# Patient Record
Sex: Female | Born: 2000
Health system: Southern US, Community
[De-identification: ages and names within clinical notes are randomized; demographics above are authoritative.]

## PROBLEM LIST (undated history)

## (undated) DIAGNOSIS — Z91010 Allergy to peanuts: Secondary | ICD-10-CM

## (undated) DIAGNOSIS — J309 Allergic rhinitis, unspecified: Secondary | ICD-10-CM

## (undated) DIAGNOSIS — J189 Pneumonia, unspecified organism: Secondary | ICD-10-CM

## (undated) DIAGNOSIS — L503 Dermatographic urticaria: Secondary | ICD-10-CM

## (undated) DIAGNOSIS — F419 Anxiety disorder, unspecified: Secondary | ICD-10-CM

## (undated) DIAGNOSIS — K9041 Non-celiac gluten sensitivity: Secondary | ICD-10-CM

## (undated) DIAGNOSIS — L309 Dermatitis, unspecified: Secondary | ICD-10-CM

## (undated) HISTORY — PX: APPENDECTOMY: SHX54

## (undated) HISTORY — DX: Allergic rhinitis, unspecified: J30.9

## (undated) HISTORY — DX: Dermatitis, unspecified: L30.9

## (undated) HISTORY — DX: Allergy to peanuts: Z91.010

## (undated) HISTORY — PX: TONSILLECTOMY: SUR1361

## (undated) HISTORY — DX: Anxiety disorder, unspecified: F41.9

## (undated) HISTORY — DX: Dermatographic urticaria: L50.3

## (undated) HISTORY — PX: ADENOIDECTOMY: SUR15

## (undated) HISTORY — DX: Non-celiac gluten sensitivity: K90.41

---
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
## 2022-01-09 DIAGNOSIS — R319 Hematuria, unspecified: Secondary | ICD-10-CM | POA: Diagnosis not present

## 2022-01-09 DIAGNOSIS — N390 Urinary tract infection, site not specified: Secondary | ICD-10-CM | POA: Diagnosis not present

## 2022-01-09 DIAGNOSIS — R300 Dysuria: Secondary | ICD-10-CM | POA: Diagnosis not present

## 2022-01-21 DIAGNOSIS — Z713 Dietary counseling and surveillance: Secondary | ICD-10-CM | POA: Diagnosis not present

## 2022-01-21 DIAGNOSIS — Z0000 Encounter for general adult medical examination without abnormal findings: Secondary | ICD-10-CM | POA: Diagnosis not present

## 2022-01-21 DIAGNOSIS — Z68.41 Body mass index (BMI) pediatric, greater than or equal to 95th percentile for age: Secondary | ICD-10-CM | POA: Diagnosis not present

## 2022-01-21 DIAGNOSIS — Z113 Encounter for screening for infections with a predominantly sexual mode of transmission: Secondary | ICD-10-CM | POA: Diagnosis not present

## 2022-01-21 DIAGNOSIS — Z23 Encounter for immunization: Secondary | ICD-10-CM | POA: Diagnosis not present

## 2022-01-21 DIAGNOSIS — Z7182 Exercise counseling: Secondary | ICD-10-CM | POA: Diagnosis not present

## 2022-01-21 DIAGNOSIS — Z1331 Encounter for screening for depression: Secondary | ICD-10-CM | POA: Diagnosis not present

## 2022-03-08 DIAGNOSIS — Z3202 Encounter for pregnancy test, result negative: Secondary | ICD-10-CM | POA: Diagnosis not present

## 2022-03-08 DIAGNOSIS — N3001 Acute cystitis with hematuria: Secondary | ICD-10-CM | POA: Diagnosis not present

## 2022-05-22 DIAGNOSIS — N898 Other specified noninflammatory disorders of vagina: Secondary | ICD-10-CM | POA: Diagnosis not present

## 2022-06-24 ENCOUNTER — Encounter: Payer: Self-pay | Admitting: Family

## 2022-06-24 ENCOUNTER — Ambulatory Visit: Payer: BC Managed Care – PPO | Admitting: Family

## 2022-06-24 VITALS — BP 120/63 | HR 73 | Temp 98.5°F | Resp 16 | Ht 68.7 in | Wt 247.0 lb

## 2022-06-24 DIAGNOSIS — F32A Depression, unspecified: Secondary | ICD-10-CM | POA: Insufficient documentation

## 2022-06-24 DIAGNOSIS — Z91010 Allergy to peanuts: Secondary | ICD-10-CM | POA: Diagnosis not present

## 2022-06-24 DIAGNOSIS — L309 Dermatitis, unspecified: Secondary | ICD-10-CM

## 2022-06-24 DIAGNOSIS — J3089 Other allergic rhinitis: Secondary | ICD-10-CM | POA: Diagnosis not present

## 2022-06-24 DIAGNOSIS — Z23 Encounter for immunization: Secondary | ICD-10-CM

## 2022-06-24 DIAGNOSIS — J309 Allergic rhinitis, unspecified: Secondary | ICD-10-CM | POA: Diagnosis not present

## 2022-06-24 DIAGNOSIS — F419 Anxiety disorder, unspecified: Secondary | ICD-10-CM | POA: Diagnosis not present

## 2022-06-24 HISTORY — DX: Depression, unspecified: F32.A

## 2022-06-24 NOTE — Progress Notes (Addendum)
Subjective:   By signing my name below, I, Annette Johns, attest that this documentation has been prepared under the direction and in the presence of Annette Sons, NP 06/24/2022   Patient ID: Annette Johns, female    DOB: September 01, 2000, 21 y.o.   MRN: [id number]  Chief Complaint  Patient presents with   New Patient (Initial Visit)    Here to establish care, comes from pediatrics.     HPI Patient is in today for establishment of patient care  Establishment of Care: She is transitioning out of pediatric care.   Immunizations: She has received three Covid vaccines. She is interested in receiving the Influenza vaccine during today's visit. She plans to get the new covid booster at the pharmacy.   Pneumonia: She reports of two episodes where she had pneumonia. She states that the last case was about 7 years ago.   Peanut Allergy: She states that she was tested for allergies twice. She reports of anaphylaxis.   Anxiety: She was diagnosed with anxiety in 2020. She has not had medication for her symptoms and mostly relied on talk therapy. She has discontinued the therapy but states that her symptoms are manageable.   Surgical History: She reports that her tonsillectomy was completed in 2014, her appendectomy completed in 2016 and adenoidectomy completed in 2014.   Social History: She reports no alcohol, smoking or vaping. She is currently a Consulting civil engineer at [employer].   Exercise: She exercises about 2-3 times a week.  Diet: She eats a mostly gluten free diet due to an intolerance. Consuming gluten gives her diarrhea and bloating. She eats about 1-2  meals a day and states that it is harder for her to eat while at school. She states that during her finals week, she wasn't eating as much due to stress.   Health Maintenance Due  Topic Date Due   COVID-19 Vaccine (1) Never done   HPV VACCINES (1 - 2-dose series) Never done   HIV Screening  Never done   Hepatitis C Screening   Never done   DTaP/Tdap/Td (1 - Tdap) Never done    Past Medical History:  Diagnosis Date   Allergic rhinitis    Anxiety    Dermagraphy    Eczema    Gluten intolerance    gets dermatitis   Peanut allergy    Pneumonia     Past Surgical History:  Procedure Laterality Date   ADENOIDECTOMY     2014   APPENDECTOMY  2016   TONSILLECTOMY  2014    Family History  Problem Relation Age of Onset   Allergic rhinitis Mother    Eczema Mother    Urticaria Mother    Arthritis Mother    Eczema Father    Allergic rhinitis Sister    Eczema Sister    Allergic rhinitis Maternal Grandmother    Eczema Maternal Grandmother    COPD Maternal Grandfather    Cancer Paternal Grandmother    Angioedema Neg Hx    Asthma Neg Hx    Immunodeficiency Neg Hx     Social History   Socioeconomic History   Marital status: Single    Spouse name: Not on file   Number of children: Not on file   Years of education: Not on file   Highest education level: Not on file  Occupational History   Not on file  Tobacco Use   Smoking status: Never   Smokeless tobacco: Never  Vaping Use   Vaping  Use: Never used  Substance and Sexual Activity   Alcohol use: Never   Drug use: Never   Sexual activity: Not Currently    Partners: Male  Other Topics Concern   Not on file  Social History Narrative   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     drospirenone-ethinyl estradiol (YAZ) 3-0.02 MG tablet Take by mouth.      EPINEPHrine (AUVI-Q) 0.3 mg/0.3 mL IJ SOAJ injection Use as directed for severe allergic reaction. 4 Device 2   Crisaborole (EUCRISA) 2 % OINT Apply 1 application topically 2 (two) times daily as needed (to red itchy areas). 100 g 5   diphenhydrAMINE (BENADRYL) 12.5 MG/5ML elixir Take by mouth.     Fluticasone Propionate (XHANCE) 93 MCG/ACT EXHU Place 2 sprays into the nose 2 (two) times daily. 32 mL 5   ketoconazole (NIZORAL) 2 % shampoo Use 1 application twice weekly for 8 weeks then stop 120 mL 0   levocetirizine (XYZAL) 5 MG tablet Take 1 tablet (5 mg total) by mouth every evening. 30 tablet 5   Olopatadine HCl 0.2 % SOLN One drop each eye once a day as needed. 2.5 mL 5   triamcinolone ointment (KENALOG) 0.5 % APP EXT AA 1 TO 2 TIMES A DAY FOR UP TO 10 TO 12 DAYS UTD  2   No facility-administered medications prior to visit.    Allergies  Allergen Reactions   Peanut Oil Anaphylaxis and Other (See Comments)    + testing     ROS See HPI    Objective:    Physical Exam Constitutional:      General: She is not in acute distress.    Appearance: Normal appearance. She is not ill-appearing.  HENT:     Head: Normocephalic and atraumatic.     Right Ear: Tympanic membrane, ear canal and external ear normal.     Left Ear: Tympanic membrane, ear canal and external ear normal.  Eyes:     Extraocular Movements: Extraocular movements intact.     Pupils: Pupils are equal, round, and reactive to light.  Neck:     Thyroid: No thyromegaly.  Cardiovascular:     Rate and Rhythm: Normal rate and regular rhythm.     Heart sounds: Normal heart sounds. No murmur heard.    No gallop.  Pulmonary:     Effort: Pulmonary effort is normal. No respiratory distress.     Breath sounds: Normal breath sounds. No wheezing or rales.  Lymphadenopathy:     Cervical: No cervical adenopathy.  Skin:    General: Skin is warm and dry.  Neurological:     Mental Status: She is alert and oriented to person,  place, and time.  Psychiatric:        Mood and Affect: Mood normal.        Behavior: Behavior normal.        Judgment: Judgment normal.     BP 120/63 (BP  Location: Right Arm, Patient Position: Sitting, Cuff Size: Small)   Pulse 73   Temp 98.5 F (36.9 C) (Oral)   Resp 16   Ht 5' 8.7" (1.745 m)   Wt 247 lb (112 kg)   SpO2 99%   BMI 36.79 kg/m  Wt Readings from Last 3 Encounters:  06/24/22 247 lb (112 kg) (>99 %, Z= 2.46)*  02/10/18 207 lb (93.9 kg) (99 %, Z= 2.29)*  04/22/15 160 lb 1.6 oz (72.6 kg) (99 %, Z= 2.20)*   * Growth percentiles are based on CDC (Girls, 2-20 Years) data.       Assessment & Plan:   Problem List Items Addressed This Visit       Unprioritized   Perennial and seasonal allergic rhinitis    Currently stable.       Peanut allergy - Primary    Keeps epipen on hand for emergency.       Eczema    She controls this with a gluten free diet.       Anxiety    Currently stable. Has managed with talk therapy in the past.       Allergic rhinitis    Currently stable. Uses zyrtec prn.       Other Visit Diagnoses     Needs flu shot       Relevant Orders   Flu Vaccine QUAD 6+ mos PF IM (Fluarix Quad PF) (Completed)      No orders of the defined types were placed in this encounter.  32 minutes spent on today's visit. Time was spent reviewing outside records, interviewing and examining patient.   I, Nance Pear, NP, personally preformed the services described in this documentation.  All medical record entries made by the scribe were at my direction and in my presence.  I have reviewed the chart and discharge instructions (if applicable) and agree that the record reflects my personal performance and is accurate and complete. 06/24/2022   I,Amber Collins,acting as a scribe for Nance Pear, NP.,have documented all relevant documentation on the behalf of Nance Pear, NP,as directed by  Nance Pear, NP while in the  presence of Nance Pear, NP.    Nance Pear, NP

## 2022-06-24 NOTE — Assessment & Plan Note (Signed)
Currently stable. Uses zyrtec prn.

## 2022-06-24 NOTE — Assessment & Plan Note (Signed)
Currently stable.

## 2022-06-24 NOTE — Assessment & Plan Note (Signed)
Currently stable. Has managed with talk therapy in the past.

## 2022-06-24 NOTE — Assessment & Plan Note (Signed)
Keeps epipen on hand for emergency.

## 2022-06-24 NOTE — Assessment & Plan Note (Signed)
She controls this with a gluten free diet.

## 2022-07-02 ENCOUNTER — Encounter: Payer: Self-pay | Admitting: Family

## 2022-07-08 ENCOUNTER — Ambulatory Visit: Payer: BC Managed Care – PPO | Admitting: Family

## 2022-07-08 VITALS — BP 133/76 | HR 96 | Temp 98.6°F | Resp 16 | Wt 226.0 lb

## 2022-07-08 DIAGNOSIS — R634 Abnormal weight loss: Secondary | ICD-10-CM

## 2022-07-08 DIAGNOSIS — R630 Anorexia: Secondary | ICD-10-CM | POA: Insufficient documentation

## 2022-07-08 DIAGNOSIS — K5901 Slow transit constipation: Secondary | ICD-10-CM

## 2022-07-08 LAB — CBC WITH DIFFERENTIAL/PLATELET
Basophils Absolute: 0.1 10*3/uL (ref 0.0–0.1)
Basophils Relative: 0.7 % (ref 0.0–3.0)
Eosinophils Absolute: 0.2 10*3/uL (ref 0.0–0.7)
Eosinophils Relative: 2.3 % (ref 0.0–5.0)
HCT: 38.8 % (ref 36.0–49.0)
Hemoglobin: 13.3 g/dL (ref 12.0–16.0)
Lymphocytes Relative: 32 % (ref 24.0–48.0)
Lymphs Abs: 3 10*3/uL (ref 0.7–4.0)
MCHC: 34.3 g/dL (ref 31.0–37.0)
MCV: 86.8 fl (ref 78.0–98.0)
Monocytes Absolute: 0.6 10*3/uL (ref 0.1–1.0)
Monocytes Relative: 6.6 % (ref 3.0–12.0)
Neutro Abs: 5.5 10*3/uL (ref 1.4–7.7)
Neutrophils Relative %: 58.4 % (ref 43.0–71.0)
Platelets: 276 10*3/uL (ref 150.0–575.0)
RBC: 4.47 Mil/uL (ref 3.80–5.70)
RDW: 12.5 % (ref 11.4–15.5)
WBC: 9.4 10*3/uL (ref 4.5–13.5)

## 2022-07-08 LAB — COMPREHENSIVE METABOLIC PANEL
ALT: 13 U/L (ref 0–35)
AST: 13 U/L (ref 0–37)
Albumin: 4.5 g/dL (ref 3.5–5.2)
Alkaline Phosphatase: 51 U/L (ref 47–119)
BUN: 8 mg/dL (ref 6–23)
CO2: 23 mEq/L (ref 19–32)
Calcium: 9.9 mg/dL (ref 8.4–10.5)
Chloride: 102 mEq/L (ref 96–112)
Creatinine, Ser: 0.85 mg/dL (ref 0.40–1.20)
GFR: 99.43 mL/min (ref >60.00)
Glucose, Bld: 98 mg/dL (ref 70–99)
Potassium: 4.3 mEq/L (ref 3.5–5.1)
Sodium: 137 mEq/L (ref 135–145)
Total Bilirubin: 0.7 mg/dL (ref 0.2–1.2)
Total Protein: 7.3 g/dL (ref 6.0–8.3)

## 2022-07-08 LAB — TSH: TSH: 0.68 u[IU]/mL (ref 0.40–5.00)

## 2022-07-08 NOTE — Assessment & Plan Note (Signed)
Wt Readings from Last 3 Encounters:  07/08/22 226 lb (102.5 kg) (99 %, Z= 2.27)*  06/24/22 247 lb (112 kg) (>99 %, Z= 2.46)*  02/10/18 207 lb (93.9 kg) (99 %, Z= 2.29)*   * Growth percentiles are based on CDC (Girls, 2-20 Years) data.   She is out of her routine being home on break from college. Will obtain labs as ordered.  Plan follow up in 1 month after she has been back to school. If persistent weight loss at that time, would consider abdominal imaging and GI evaluation.

## 2022-07-08 NOTE — Assessment & Plan Note (Signed)
Reports that she moves her bowels most days- no current concerns about constipation.

## 2022-07-08 NOTE — Assessment & Plan Note (Signed)
Labs as ordered

## 2022-07-08 NOTE — Progress Notes (Signed)
Subjective:   By signing my name below, I, Shehryar Baig, attest that this documentation has been prepared under the direction and in the presence of [name], NP. 07/08/2022   Patient ID: Annette Johns, female    DOB: [birth year], 22 y.o.   MRN: [id number]  Chief Complaint  Patient presents with   Anorexia    Patient complains of having little appetite at times.     HPI Patient is in today for a office visit.   She has lost 20 lb's recently due to having little to no appetite. She had nausea on 06/30/2022 due to not eating but has not had any since then. She is also experiencing more muscle cramps more often. She was experiencing more diarrhea around 06/30/2024 but her symptoms are resolved at this time. She feels warmer than usual. Her mother noticed her decreased appetite and urged her to make an appointment. She typically has low appetite while at school due to lack of appetizing food choices.   Wt Readings from Last 3 Encounters:  07/08/22 226 lb (102.5 kg) (99 %, Z= 2.27)*  06/24/22 247 lb (112 kg) (>99 %, Z= 2.46)*  02/10/18 207 lb (93.9 kg) (99 %, Z= 2.29)*   * Growth percentiles are based on CDC (Girls, 2-20 Years) data.    Past Medical History:  Diagnosis Date   Allergic rhinitis    Anxiety    Dermagraphy    Eczema    Gluten intolerance    gets dermatitis   Peanut allergy    Pneumonia     Past Surgical History:  Procedure Laterality Date   ADENOIDECTOMY     2014   APPENDECTOMY  2016   TONSILLECTOMY  2014    Family History  Problem Relation Age of Onset   Allergic rhinitis Mother    Eczema Mother    Urticaria Mother    Arthritis Mother    Eczema Father    Allergic rhinitis Sister    Eczema Sister    Allergic rhinitis Maternal Grandmother    Eczema Maternal Grandmother    COPD Maternal Grandfather    Cancer Paternal Grandmother    Angioedema Neg Hx    Asthma Neg Hx    Immunodeficiency Neg Hx     Social History   Socioeconomic  History   Marital status: Single    Spouse name: Not on file   Number of children: Not on file   Years of education: Not on file   Highest education level: Not on file  Occupational History   Not on file  Tobacco Use   Smoking status: Never   Smokeless tobacco: Never  Vaping Use   Vaping Use: Never used  Substance and Sexual Activity   Alcohol use: Never   Drug use: Never   Sexual activity: Not Currently    Partners: Male  Other Topics Concern   Not on file  Social History Narrative   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     drospirenone-ethinyl estradiol (YAZ) 3-0.02 MG tablet Take by mouth.     EPINEPHrine (AUVI-Q) 0.3 mg/0.3 mL IJ SOAJ injection Use as directed for severe allergic reaction. 4 Device 2   No facility-administered medications prior to visit.    Allergies  Allergen Reactions   Peanut Oil Anaphylaxis and Other (See Comments)    + testing     Review of Systems  Constitutional:  Positive for weight loss.       (+)decreased appetite  Gastrointestinal:  Negative for diarrhea and nausea.  Musculoskeletal:        (+)frequent muscle cramps       Objective:    Physical Exam Constitutional:      General: She is not in acute distress.    Appearance: Normal appearance. She is not ill-appearing.  HENT:     Head: Normocephalic and atraumatic.     Right Ear: External ear normal.     Left Ear: External ear normal.  Eyes:     Extraocular Movements:  Extraocular movements intact.     Pupils: Pupils are equal, round, and reactive to light.  Cardiovascular:     Rate and Rhythm: Normal rate and regular rhythm.     Heart sounds: Normal heart sounds. No murmur heard.    No gallop.  Pulmonary:     Effort: Pulmonary effort is normal. No respiratory distress.     Breath sounds: Normal breath sounds. No wheezing or rales.  Skin:    General: Skin is warm and dry.  Neurological:     Mental Status: She is alert and oriented to person, place, and time.  Psychiatric:        Judgment: Judgment normal.     BP 133/76 (BP Location: Right Arm, Patient Position: Sitting, Cuff Size: Large)   Pulse 96   Temp 98.6 F (37 C) (Oral)   Resp 16   Wt 226 lb (102.5 kg)   SpO2 99%   BMI 33.67 kg/m  Wt Readings from Last 3 Encounters:  07/08/22 226 lb (102.5 kg) (99 %, Z= 2.27)*  06/24/22 247 lb (112 kg) (>99 %, Z= 2.46)*  02/10/18 207 lb (93.9 kg) (99 %, Z= 2.29)*   * Growth percentiles are based on CDC (Girls, 2-20 Years) data.       Assessment & Plan:  Anorexia Assessment & Plan: Wt Readings from Last 3 Encounters:  07/08/22 226 lb (102.5 kg) (99 %, Z= 2.27)*  06/24/22 247 lb (112 kg) (>99 %, Z= 2.46)*  02/10/18 207 lb (93.9 kg) (99 %, Z= 2.29)*   * Growth percentiles are based on CDC (Girls, 2-20 Years) data.   She is out of her routine being home on break from college. Will obtain labs as ordered.  Plan follow up in 1 month after she has been back to school. If persistent weight loss at that time, would consider abdominal imaging and GI evaluation.    Orders: -     Comprehensive metabolic panel -     TSH -     CBC with Differential/Platelet  Weight loss Assessment & Plan: Labs as ordered.   Orders: -     Comprehensive metabolic panel -     TSH -     CBC with Differential/Platelet  Slow transit constipation Assessment & Plan: Reports that she moves her bowels most days- no current concerns about constipation.      I,  Lemont Fillers, NP, personally preformed the services  described in this documentation.  All medical record entries made by the scribe were at my direction and in my presence.  I have reviewed the chart and discharge instructions (if applicable) and agree that the record reflects my personal performance and is accurate and complete. 07/08/2022   I,Shehryar Baig,acting as a Education administrator for Nance Pear, NP.,have documented all relevant documentation on the behalf of Nance Pear, NP,as directed by  Nance Pear, NP while in the presence of Nance Pear, NP.   Nance Pear, NP

## 2022-08-08 ENCOUNTER — Encounter: Payer: Self-pay | Admitting: Family

## 2022-08-08 ENCOUNTER — Telehealth (INDEPENDENT_AMBULATORY_CARE_PROVIDER_SITE_OTHER): Payer: BC Managed Care – PPO | Admitting: Family

## 2022-08-08 DIAGNOSIS — R630 Anorexia: Secondary | ICD-10-CM

## 2022-08-08 DIAGNOSIS — F32A Depression, unspecified: Secondary | ICD-10-CM | POA: Diagnosis not present

## 2022-08-08 DIAGNOSIS — F419 Anxiety disorder, unspecified: Secondary | ICD-10-CM

## 2022-08-08 MED ORDER — ESCITALOPRAM OXALATE 10 MG PO TABS: 10.0000 mg | ORAL_TABLET | Freq: Every day | ORAL | 0 refills | Status: DC

## 2022-08-08 NOTE — Assessment & Plan Note (Signed)
She has lost some additional weight.  Hopefully when we get her mood under better control her appetite will also improve.

## 2022-08-08 NOTE — Assessment & Plan Note (Addendum)
Uncontrolled. Encouraged her to schedule an appointment with a counselor at school.  Will initiate lexapro 1/2 tab once daily for 1 week, then increase to a full tab once daily on week two.

## 2022-08-08 NOTE — Progress Notes (Signed)
MyChart Video Visit    Virtual Visit via Video Note   This visit type was conducted due to national recommendations for restrictions regarding the COVID-19 Pandemic (e.g. social distancing) in an effort to limit this patient's exposure and mitigate transmission in our community. This patient is at least at moderate risk for complications without adequate follow up. This format is felt to be most appropriate for this patient at this time. Physical exam was limited by quality of the video and audio technology used for the visit. CMA was able to get the patient set up on a video visit.  Patient location: Home. Patient and provider in visit Provider location: Office  I discussed the limitations of evaluation and management by telemedicine and the availability of in person appointments. The patient expressed understanding and agreed to proceed.  Visit Date: 08/08/2022  Today's healthcare provider: Nance Pear, NP     Subjective:    Patient ID: Annette Johns, female    DOB: 04/20/2001, 22 y.o.   MRN: [id number]  Chief Complaint  Patient presents with   Anorexia    Patient reports she is eating a little better    HPI  Weight today is 217lb appetite is still off.  Only eating 1 meal a day.  No longer experiencing as much issue with GI cramping.   Reports that she some anxiety.  Notes some trouble motivating.Sleep habits need improvement.  Goes to bed after midnight around 1-2 PM.  Wakes up around 8:30.  Reports tearfulness notes tearfulness every other day. Notes anxiety and irrational worry seems to play a roll. Relationship issues with Significant other.      Past Medical History:  Diagnosis Date   Allergic rhinitis    Anxiety    Anxiety and depression 06/24/2022   Dermagraphy    Eczema    Gluten intolerance    gets dermatitis   Peanut allergy    Pneumonia     Past Surgical History:  Procedure Laterality Date   ADENOIDECTOMY     2014    APPENDECTOMY  2016   TONSILLECTOMY  2014    Family History  Problem Relation Age of Onset   Allergic rhinitis Mother    Eczema Mother    Urticaria Mother    Arthritis Mother    Eczema Father    Allergic rhinitis Sister    Eczema Sister    Allergic rhinitis Maternal Grandmother    Eczema Maternal Grandmother    COPD Maternal Grandfather    Cancer Paternal Grandmother    Angioedema Neg Hx    Asthma Neg Hx    Immunodeficiency Neg Hx     Social History   Socioeconomic History   Marital status: Single    Spouse name: Not on file   Number of children: Not on file   Years of education: Not on file   Highest education level: Not on file  Occupational History   Not on file  Tobacco Use   Smoking status: Never   Smokeless tobacco: Never  Vaping Use   Vaping Use: Never used  Substance and Sexual Activity   Alcohol use: Never   Drug use: Never   Sexual activity: Not Currently    Partners: Male  Other Topics Concern   Not on file  Social History Narrative   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     drospirenone-ethinyl estradiol (YAZ) 3-0.02 MG tablet Take by mouth.     EPINEPHrine (AUVI-Q) 0.3 mg/0.3 mL IJ SOAJ injection Use as directed for severe allergic reaction. 4 Device 2   No facility-administered medications prior to visit.    Allergies  Allergen Reactions   Peanut Oil Anaphylaxis and Other (See Comments)    + testing     ROS  See HPI     Objective:     Physical Exam  There were no vitals taken for this visit. Wt Readings from Last 3 Encounters:  07/08/22 226 lb (102.5 kg) (99 %, Z= 2.27)*  06/24/22 247 lb (112 kg) (>99 %, Z= 2.46)*  02/10/18 207 lb (93.9 kg) (99 %, Z= 2.29)*   * Growth percentiles are based on CDC (Girls, 2-20 Years) data.   Gen: Awake, alert, no acute distress Resp: Breathing is even and non-labored Psych: calm/pleasant demeanor- slightly flattened affect Neuro: Alert and Oriented x 3, + facial symmetry, speech is clear.      Assessment & Plan:   Problem List Items Addressed This Visit       Unprioritized   Anxiety and depression - Primary    Uncontrolled. Encouraged her to schedule an appointment with a counselor at school.  Will initiate lexapro 1/2 tab once daily for 1 week, then increase to a full tab once daily on week two.       Relevant Medications   escitalopram (LEXAPRO) 10 MG tablet   Anorexia    She has lost some additional weight.  Hopefully when we get her mood under better control her appetite will also improve.         I am having Kerrie D. Iglesias start on escitalopram. I am also having her maintain her cetirizine, EPINEPHrine, and drospirenone-ethinyl estradiol.  Meds ordered this encounter  Medications   escitalopram (LEXAPRO) 10 MG tablet    Sig: Take 1 tablet (10 mg total) by mouth daily.    Dispense:  30 tablet    Refill:  0    Order Specific Question:   Supervising Provider    Answer:   Penni Homans A [[id number]]    I discussed the assessment and treatment plan with the patient. The patient was provided an opportunity to ask questions and all were answered. The patient agreed with the plan and demonstrated an understanding of the instructions.   The patient was advised to call back or seek an in-person evaluation if the symptoms worsen or if the condition fails to improve as anticipated.  Nance Pear, NP [facility] (phone) [phone] (fax)  [place]

## 2022-08-08 NOTE — Progress Notes (Deleted)
New Patient Office Visit  Subjective    Patient ID: Annette Johns, female    DOB: 14-Nov-2000  Age: 22 y.o. MRN: [id number]  CC: No chief complaint on file.   HPI Aylin Rhoads presents to establish care ***  Outpatient Encounter Medications as of 08/08/2022  Medication Sig   cetirizine (ZYRTEC) 10 MG tablet Take 10 mg by mouth daily as needed for allergies or rhinitis.   drospirenone-ethinyl estradiol (YAZ) 3-0.02 MG tablet Take by mouth.   EPINEPHrine (AUVI-Q) 0.3 mg/0.3 mL IJ SOAJ injection Use as directed for severe allergic reaction.   No facility-administered encounter medications on file as of 08/08/2022.    Past Medical History:  Diagnosis Date   Allergic rhinitis    Anxiety    Dermagraphy    Eczema    Gluten intolerance    gets dermatitis   Peanut allergy    Pneumonia     Past Surgical History:  Procedure Laterality Date   ADENOIDECTOMY     2014   APPENDECTOMY  2016   TONSILLECTOMY  2014    Family History  Problem Relation Age of Onset   Allergic rhinitis Mother    Eczema Mother    Urticaria Mother    Arthritis Mother    Eczema Father    Allergic rhinitis Sister    Eczema Sister    Allergic rhinitis Maternal Grandmother    Eczema Maternal Grandmother    COPD Maternal Grandfather    Cancer Paternal Grandmother    Angioedema Neg Hx    Asthma Neg Hx    Immunodeficiency Neg Hx     Social History   Socioeconomic History   Marital status: Single    Spouse name: Not on file   Number of children: Not on file   Years of education: Not on file   Highest education level: Not on file  Occupational History   Not on file  Tobacco Use   Smoking status: Never   Smokeless tobacco: Never  Vaping Use   Vaping Use: Never used  Substance and Sexual Activity   Alcohol use: Never   Drug use: Never   Sexual activity: Not Currently    Partners: Male  Other Topics Concern   Not on file  Social History Narrative   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  Physical Exam Vitals reviewed.  Constitutional:      General: She is not in acute distress.    Appearance: Normal appearance. She is not ill-appearing or toxic-appearing.  HENT:     Head: Normocephalic and atraumatic.     Right Ear: Tympanic membrane, ear canal and external ear normal. There is no impacted cerumen.     Left Ear: Tympanic membrane, ear canal and external ear normal. There is no impacted cerumen.     Mouth/Throat:     Mouth: Mucous membranes are moist.     Pharynx: Oropharynx is clear. No oropharyngeal exudate or posterior oropharyngeal erythema.  Eyes:  General:        Right eye: No discharge.        Left eye: No discharge.     Conjunctiva/sclera: Conjunctivae normal.     Pupils: Pupils are equal, round, and reactive to light.  Neck:     Thyroid: No thyromegaly.  Cardiovascular:     Rate and Rhythm: Normal rate and regular rhythm.     Heart sounds: Normal heart sounds. No murmur heard.    No friction rub. No gallop.  Pulmonary:     Effort: Pulmonary effort is normal. No respiratory distress.     Breath sounds: Normal breath sounds.  Abdominal:     General: Abdomen is flat. Bowel sounds are normal.     Palpations: Abdomen is soft.  Musculoskeletal:        General: Normal range of motion.     Cervical back: Normal range of motion.     Right lower leg: No edema.     Left lower leg: No edema.  Lymphadenopathy:     Cervical: No cervical adenopathy.  Skin:    General: Skin is warm and dry.  Neurological:     General: No  focal deficit present.     Mental Status: She is alert. Mental status is at baseline.  Psychiatric:        Mood and Affect: Mood normal.        Behavior: Behavior normal.        Thought Content: Thought content normal.        Judgment: Judgment normal.    {Labs (Optional):23779}    Assessment & Plan:   Preventative health care    No follow-ups on file.   Valarie Merino, NP

## 2022-08-29 DIAGNOSIS — N3000 Acute cystitis without hematuria: Secondary | ICD-10-CM | POA: Diagnosis not present

## 2022-08-29 DIAGNOSIS — R300 Dysuria: Secondary | ICD-10-CM | POA: Diagnosis not present

## 2022-09-05 ENCOUNTER — Telehealth (INDEPENDENT_AMBULATORY_CARE_PROVIDER_SITE_OTHER): Payer: BC Managed Care – PPO | Admitting: Family

## 2022-09-05 ENCOUNTER — Telehealth: Payer: BC Managed Care – PPO | Admitting: Family

## 2022-09-05 VITALS — Wt 219.0 lb

## 2022-09-05 DIAGNOSIS — R630 Anorexia: Secondary | ICD-10-CM

## 2022-09-05 DIAGNOSIS — F419 Anxiety disorder, unspecified: Secondary | ICD-10-CM | POA: Diagnosis not present

## 2022-09-05 DIAGNOSIS — F32A Depression, unspecified: Secondary | ICD-10-CM | POA: Diagnosis not present

## 2022-09-05 DIAGNOSIS — R030 Elevated blood-pressure reading, without diagnosis of hypertension: Secondary | ICD-10-CM

## 2022-09-05 MED ORDER — ESCITALOPRAM OXALATE 10 MG PO TABS: 10.0000 mg | ORAL_TABLET | Freq: Every day | ORAL | 1 refills | Status: DC

## 2022-09-05 NOTE — Assessment & Plan Note (Signed)
She has had 2 elevated readings at her school clinic.  She plans to come home for spring break in 1 week and I have advised her to schedule an in office visit so we can recheck blood pressure. If still elevated will need to consider medication treatment. We discussed importance of low sodium diet/exercise, weight loss for BP management.

## 2022-09-05 NOTE — Assessment & Plan Note (Signed)
Mood is improved with the addition of lexapro '10mg'$ . Will continue current dosing. She also plans to get established with a counselor.

## 2022-09-05 NOTE — Progress Notes (Signed)
MyChart Video Visit    Virtual Visit via Video Note    Patient location: Home Patient and provider in visit Provider location: Office  I discussed the limitations of evaluation and management by telemedicine and the availability of in person appointments. The patient expressed understanding and agreed to proceed.  Visit Date: 09/05/2022  Today's healthcare provider: Nance Pear, NP     Subjective:    Patient ID: Annette Johns, female    DOB: 2000/07/25, 22 y.o.   MRN: [id number]  Chief Complaint  Patient presents with   Anxiety    Follow up, Better with medication   Elevated  blood pressure readin    Patient reports having elevated bp reading at school clinic 162/99 and 147/82 when repeated    HPI Patient is in today for video follow up visit.   Weight: During last visit she discussed losing weight, nausea, and decreased appetite. Her most recent weight was 219 lb's. Her nausea has resolved and her appetite has improved. She notices her appetite is more so linked with her mood. She notes her appetite has felt lower this week.  Wt Readings from Last 3 Encounters:  09/05/22 219 lb (99.3 kg) (99 %, Z= 2.19)*  07/08/22 226 lb (102.5 kg) (99 %, Z= 2.27)*  06/24/22 247 lb (112 kg) (>99 %, Z= 2.46)*   * Growth percentiles are based on CDC (Girls, 2-20 Years) data.   Mood: She continues taking 10 mg lexapro daily PO and reports no new issues while taking it. Feels like motivation and mood are improved.  Less tearfulness.  UTI: She reports having a UTI since her last visit and had it treated. She notes her blood pressure was elevated while getting treated for the UTI and continued staying elevated. She reports her blood pressure measured 162/99 and 147/82 during the manual recheck at the office.  BP Readings from Last 3 Encounters:  07/08/22 133/76  06/24/22 120/63  02/10/18 128/70 (95 %, Z = 1.64 /  64 %, Z = 0.36)*   *BP percentiles are based on the 2017  AAP Clinical Practice Guideline for girls   Pulse Readings from Last 3 Encounters:  07/08/22 96  06/24/22 73  02/10/18 88    Past Medical History:  Diagnosis Date   Allergic rhinitis    Anxiety    Anxiety and depression 06/24/2022   Dermagraphy    Eczema    Gluten intolerance    gets dermatitis   Peanut allergy    Pneumonia     Past Surgical History:  Procedure Laterality Date   ADENOIDECTOMY     2014   APPENDECTOMY  2016   TONSILLECTOMY  2014    Family History  Problem Relation Age of Onset   Allergic rhinitis Mother    Eczema Mother    Urticaria Mother    Arthritis Mother    Eczema Father    Allergic rhinitis Sister    Eczema Sister    Allergic rhinitis Maternal Grandmother    Eczema Maternal Grandmother    COPD Maternal Grandfather    Cancer Paternal Grandmother    Angioedema Neg Hx    Asthma Neg Hx    Immunodeficiency Neg Hx     Social History   Socioeconomic History   Marital status: Single    Spouse name: Not on file   Number of children: Not on file   Years of education: Not on file   Highest education level: Not on file  Occupational  History   Not on file  Tobacco Use   Smoking status: Never   Smokeless tobacco: Never  Vaping Use   Vaping Use: Never used  Substance and Sexual Activity   Alcohol use: Never   Drug use: Never   Sexual activity: Not Currently    Partners: Male  Other Topics Concern   Not on file  Social History Narrative   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     drospirenone-ethinyl estradiol (YAZ) 3-0.02 MG tablet Take by mouth.     EPINEPHrine (AUVI-Q) 0.3 mg/0.3 mL IJ SOAJ injection Use as directed for severe allergic reaction. 4 Device 2   escitalopram (LEXAPRO) 10 MG tablet Take 1 tablet (10 mg total) by mouth daily. 30 tablet 0   No facility-administered medications prior to visit.    Allergies  Allergen Reactions   Peanut Oil Anaphylaxis and Other (See Comments)    + testing     Review of Systems  Constitutional:  Positive for weight loss.       Objective:    Physical Exam  Wt 219 lb (99.3 kg)   BMI 32.62 kg/m  Wt Readings from Last 3 Encounters:  09/05/22 219 lb (99.3 kg) (99 %, Z= 2.19)*  07/08/22 226 lb (102.5 kg) (99 %, Z= 2.27)*  06/24/22 247 lb (112 kg) (>99 %, Z= 2.46)*   * Growth percentiles are based on CDC (Girls, 2-20 Years) data.    Gen: Awake, alert, no acute distress Resp: Breathing is even and non-labored Psych: calm/pleasant demeanor Neuro: Alert and Oriented x 3, + facial symmetry, speech is clear.     Assessment & Plan:  Anxiety and depression Assessment & Plan: Mood is improved with the addition of lexapro '10mg'$ . Will continue current dosing. She also plans to get established with a counselor.   Anorexia Assessment & Plan: Notes appetite is improving.  Lab work for weight loss was normal last visit. I am OK with her losing weight, however I want to make sure that it is intentional.    Elevated blood pressure reading Assessment & Plan: She has had 2 elevated readings at her school clinic.  She plans to come home for spring break in 1 week and I have advised her to schedule an in office visit so we can recheck blood pressure. If still elevated will need to consider medication treatment. We discussed importance of low sodium diet/exercise, weight loss for BP management.   Other orders -     Escitalopram Oxalate; Take 1 tablet (10 mg total) by mouth  daily.  Dispense: 90 tablet; Refill: 1     I discussed the assessment and treatment plan with the patient. The patient was provided an opportunity to ask questions and all were answered. The patient agreed with the plan and demonstrated an understanding of the instructions.   The patient was advised to call back or seek an in-person evaluation if the symptoms worsen or if  the condition fails to improve as anticipated.  Nance Pear, NP [facility] (phone) [phone] (fax)  [place]    I,Shehryar Baig,acting as a scribe for Nance Pear, NP.,have documented all relevant documentation on the behalf of Nance Pear, NP,as directed by  Nance Pear, NP while in the presence of Nance Pear, NP.

## 2022-09-05 NOTE — Assessment & Plan Note (Addendum)
Notes appetite is improving.  Lab work for weight loss was normal last visit. I am OK with her losing weight, however I want to make sure that it is intentional.   Wt Readings from Last 3 Encounters:  09/05/22 219 lb (99.3 kg) (99 %, Z= 2.19)*  07/08/22 226 lb (102.5 kg) (99 %, Z= 2.27)*  06/24/22 247 lb (112 kg) (>99 %, Z= 2.46)*   * Growth percentiles are based on CDC (Girls, 2-20 Years) data.

## 2022-09-11 DIAGNOSIS — N398 Other specified disorders of urinary system: Secondary | ICD-10-CM | POA: Diagnosis not present

## 2022-09-11 DIAGNOSIS — N390 Urinary tract infection, site not specified: Secondary | ICD-10-CM | POA: Diagnosis not present

## 2022-09-16 ENCOUNTER — Ambulatory Visit: Payer: BC Managed Care – PPO | Admitting: Family

## 2022-09-16 VITALS — BP 136/85 | HR 75 | Temp 98.2°F | Resp 16 | Wt 212.0 lb

## 2022-09-16 DIAGNOSIS — R634 Abnormal weight loss: Secondary | ICD-10-CM

## 2022-09-16 DIAGNOSIS — F32A Depression, unspecified: Secondary | ICD-10-CM | POA: Diagnosis not present

## 2022-09-16 DIAGNOSIS — F419 Anxiety disorder, unspecified: Secondary | ICD-10-CM

## 2022-09-16 DIAGNOSIS — R030 Elevated blood-pressure reading, without diagnosis of hypertension: Secondary | ICD-10-CM | POA: Diagnosis not present

## 2022-09-16 NOTE — Progress Notes (Signed)
Subjective:   By signing my name below, I, Annette Johns, attest that this documentation has been prepared under the direction and in the presence of Annette Alar, NP. 09/16/2022.   Patient ID: Annette Johns, female    DOB: 2001/06/28, 22 y.o.   MRN: [id number]  Chief Complaint  Patient presents with   Blodd pressure elevated    Patient reports having elevated BP reading at school up to 162/99 with a repeat reading of 147 /82    HPI Patient is in today for an office visit.  Blood Pressure: She reports that her blood pressure was elevated when she had it checked twice at school. She believes that she may have been stressed. Of note, she also drinks a lot of caffeine and energy drinks while at school. Recently she has started cutting back on these drinks. Not on any medications such as sudafed at the time of her BP checks.  BP Readings from Last 3 Encounters:  09/16/22 136/85  07/08/22 133/76  06/24/22 120/63   Appetite:  Generally she is not feeling very hungry. She feels as though her stomach is restricted and only able to tolerate smaller portions. Her appetite is still not back to baseline and she has noticed some further weight loss. She typically uses the dining hall at school. She frequently only has 1 meal a day. Wt Readings from Last 3 Encounters:  09/16/22 212 lb (96.2 kg) (98 %, Z= 2.11)*  09/05/22 219 lb (99.3 kg) (99 %, Z= 2.19)*  07/08/22 226 lb (102.5 kg) (99 %, Z= 2.27)*   * Growth percentiles are based on CDC (Girls, 2-20 Years) data.   Mood:  Currently she is feeling stable on Lexapro 10 mg daily.  Past Medical History:  Diagnosis Date   Allergic rhinitis    Anxiety    Anxiety and depression 06/24/2022   Dermagraphy    Eczema    Gluten intolerance    gets dermatitis   Peanut allergy    Pneumonia     Past Surgical History:  Procedure Laterality Date   ADENOIDECTOMY     2014   APPENDECTOMY  2016   TONSILLECTOMY  2014    Family History   Problem Relation Age of Onset   Allergic rhinitis Mother    Eczema Mother    Urticaria Mother    Arthritis Mother    Eczema Father    Allergic rhinitis Sister    Eczema Sister    Allergic rhinitis Maternal Grandmother    Eczema Maternal Grandmother    COPD Maternal Grandfather    Cancer Paternal Grandmother    Angioedema Neg Hx    Asthma Neg Hx    Immunodeficiency Neg Hx     Social History   Socioeconomic History   Marital status: Single    Spouse name: Not on file   Number of children: Not on file   Years of education: Not on file   Highest education level: Not on file  Occupational History   Not on file  Tobacco Use   Smoking status: Never   Smokeless tobacco: Never  Vaping Use   Vaping Use: Never used  Substance and Sexual Activity   Alcohol use: Never   Drug use: Never   Sexual activity: Not Currently    Partners: Male  Other Topics Concern   Not on file  Social History Narrative   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     drospirenone-ethinyl estradiol (YAZ) 3-0.02 MG tablet Take by mouth.     EPINEPHrine (AUVI-Q) 0.3 mg/0.3 mL IJ SOAJ injection Use as directed for severe allergic reaction. 4 Device 2   escitalopram (LEXAPRO) 10 MG tablet Take 1 tablet (10 mg total) by mouth daily. 90 tablet 1   No facility-administered medications prior to visit.    Allergies  Allergen Reactions   Peanut Oil Anaphylaxis and Other (See Comments)    + testing      Review of Systems  Gastrointestinal:        Loss of appetite       Objective:    Physical Exam Constitutional:      Appearance: Normal appearance.  HENT:     Head: Normocephalic and atraumatic.     Right Ear: Tympanic membrane, ear canal and external ear normal.     Left Ear: Tympanic membrane, ear canal and external ear normal.  Eyes:     Extraocular Movements: Extraocular movements intact.     Pupils: Pupils are equal, round, and reactive to light.  Cardiovascular:     Rate and Rhythm: Normal rate and regular rhythm.     Heart sounds: Normal heart sounds. No murmur heard.    No gallop.  Pulmonary:     Effort: Pulmonary effort is normal. No respiratory distress.     Breath sounds: Normal breath sounds. No wheezing or rales.  Skin:    General: Skin is warm and dry.  Neurological:     General: No focal deficit present.     Mental Status: She is alert and oriented to person, place, and time.  Psychiatric:        Mood and Affect: Mood normal.        Behavior: Behavior normal.     BP 136/85 (BP Location: Right Arm, Patient Position: Sitting, Cuff Size: Large)   Pulse 75   Temp 98.2 F (36.8 C) (Oral)   Resp 16   Wt 212 lb (96.2 kg)   SpO2 99%   BMI 31.58 kg/m  Wt Readings from Last 3 Encounters:  09/16/22 212 lb (96.2 kg) (98 %, Z= 2.11)*  09/05/22 219 lb (99.3 kg) (99 %, Z= 2.19)*  07/08/22 226 lb (102.5 kg) (99 %, Z= 2.27)*   * Growth percentiles are based on CDC (Girls, 2-20 Years) data.     Assessment & Plan:   Problem List Items Addressed This Visit       Unprioritized   Weight loss    Wt Readings from Last 3 Encounters:  09/16/22 212 lb (96.2 kg) (98 %, Z= 2.11)*  09/05/22 219 lb (99.3 kg) (99 %, Z= 2.19)*  07/08/22 226 lb (102.5 kg) (99 %, Z= 2.27)*   * Growth percentiles are based on CDC (Girls, 2-20 Years) data.  She is only eating 1-2 times daily.  Seems that when she is stressed her appetite is less.  I encouraged her to try to eat 3 times  a day.  Recent lab work including CMET,  TSH, CBC was reassuring. Goal weight is about 180 from a BMI standpoint so I am OK with slow weight loss at this point. She will continue to monitor.        Elevated blood pressure reading - Primary    BP Readings from Last 3 Encounters:  09/16/22 136/85  07/08/22 133/76  06/24/22 120/63  BP looks OK here today.  Recommended the following:  Work on a low sodium diet.  Goal blood pressure is <140/90. Ideal is 120/80.   Please purchase a blood pressure cuff.  OMRON is one of the better ones. Check blood pressure 2 x weekly and send me any readings you get that are >140/90.        Anxiety and depression    Mood is stable/improved. Continue lexapro '10mg'$  once daily.         No orders of the defined types were placed in this encounter.   I, Nance Pear, NP, personally preformed the services described in this documentation.  All medical record entries made by the scribe were at my direction and in my presence.  I have reviewed the chart and discharge instructions (if applicable) and agree that the record reflects my personal performance and is accurate and complete. 09/16/2022.  I,Mathew Stumpf,acting as a Education administrator for [employer], NP.,have documented all relevant documentation on the behalf of Nance Pear, NP,as directed by  Nance Pear, NP while in the presence of Nance Pear, NP.   Nance Pear, NP

## 2022-09-16 NOTE — Assessment & Plan Note (Signed)
Mood is stable/improved. Continue lexapro '10mg'$  once daily.

## 2022-09-16 NOTE — Assessment & Plan Note (Addendum)
Wt Readings from Last 3 Encounters:  09/16/22 212 lb (96.2 kg) (98 %, Z= 2.11)*  09/05/22 219 lb (99.3 kg) (99 %, Z= 2.19)*  07/08/22 226 lb (102.5 kg) (99 %, Z= 2.27)*   * Growth percentiles are based on CDC (Girls, 2-20 Years) data.   She is only eating 1-2 times daily.  Seems that when she is stressed her appetite is less.  I encouraged her to try to eat 3 times a day.  Recent lab work including CMET, TSH, CBC was reassuring. Goal weight is about 180 from a BMI standpoint so I am OK with slow weight loss at this point. She will continue to monitor.

## 2022-09-16 NOTE — Patient Instructions (Signed)
Goal blood pressure is <140/90. Ideal is 120/80.   Please purchase a blood pressure cuff.  OMRON is one of the better ones. Check blood pressure 2 x weekly and send me any readings you get that are >140/90.

## 2022-09-16 NOTE — Assessment & Plan Note (Signed)
BP Readings from Last 3 Encounters:  09/16/22 136/85  07/08/22 133/76  06/24/22 120/63   BP looks OK here today.  Recommended the following:  Work on a low sodium diet.  Goal blood pressure is <140/90. Ideal is 120/80.   Please purchase a blood pressure cuff.  OMRON is one of the better ones. Check blood pressure 2 x weekly and send me any readings you get that are >140/90.

## 2022-12-05 ENCOUNTER — Other Ambulatory Visit: Payer: Self-pay | Admitting: Family

## 2022-12-05 ENCOUNTER — Encounter: Payer: Self-pay | Admitting: Family

## 2022-12-05 MED ORDER — ESCITALOPRAM OXALATE 10 MG PO TABS: 10.0000 mg | ORAL_TABLET | Freq: Every day | ORAL | 1 refills | Status: DC

## 2022-12-05 MED ORDER — DROSPIRENONE-ETHINYL ESTRADIOL 3-0.02 MG PO TABS: 1.0000 | ORAL_TABLET | Freq: Every day | ORAL | 3 refills | Status: DC

## 2022-12-05 NOTE — Telephone Encounter (Signed)
Patient is requesting a refill of the following medications: Requested Prescriptions   Pending Prescriptions Disp Refills   drospirenone-ethinyl estradiol (YAZ) 3-0.02 MG tablet 28 tablet     Sig: Take by mouth.    Date of patient request: 12/05/22 Last office visit: 09/16/2022 Date of last refill: 01/21/22 Historical Provider Last refill amount: 28 with refills

## 2023-02-04 ENCOUNTER — Encounter (INDEPENDENT_AMBULATORY_CARE_PROVIDER_SITE_OTHER): Payer: Self-pay

## 2023-02-13 DIAGNOSIS — R300 Dysuria: Secondary | ICD-10-CM | POA: Diagnosis not present

## 2023-02-23 ENCOUNTER — Encounter: Payer: BC Managed Care – PPO | Admitting: Family

## 2023-02-26 DIAGNOSIS — Z113 Encounter for screening for infections with a predominantly sexual mode of transmission: Secondary | ICD-10-CM | POA: Diagnosis not present

## 2023-02-26 DIAGNOSIS — Z1330 Encounter for screening examination for mental health and behavioral disorders, unspecified: Secondary | ICD-10-CM | POA: Diagnosis not present

## 2023-04-23 DIAGNOSIS — H5202 Hypermetropia, left eye: Secondary | ICD-10-CM | POA: Diagnosis not present

## 2023-05-04 DIAGNOSIS — N898 Other specified noninflammatory disorders of vagina: Secondary | ICD-10-CM | POA: Diagnosis not present

## 2023-05-04 DIAGNOSIS — Z113 Encounter for screening for infections with a predominantly sexual mode of transmission: Secondary | ICD-10-CM | POA: Diagnosis not present

## 2023-06-10 DIAGNOSIS — N898 Other specified noninflammatory disorders of vagina: Secondary | ICD-10-CM | POA: Diagnosis not present

## 2023-06-10 DIAGNOSIS — Z8619 Personal history of other infectious and parasitic diseases: Secondary | ICD-10-CM | POA: Diagnosis not present

## 2023-08-03 DIAGNOSIS — M79641 Pain in right hand: Secondary | ICD-10-CM | POA: Diagnosis not present

## 2023-09-22 DIAGNOSIS — M79641 Pain in right hand: Secondary | ICD-10-CM | POA: Diagnosis not present

## 2023-09-22 DIAGNOSIS — R2231 Localized swelling, mass and lump, right upper limb: Secondary | ICD-10-CM | POA: Diagnosis not present

## 2023-09-29 DIAGNOSIS — R2231 Localized swelling, mass and lump, right upper limb: Secondary | ICD-10-CM | POA: Diagnosis not present

## 2023-10-22 ENCOUNTER — Other Ambulatory Visit: Payer: Self-pay | Admitting: *Deleted

## 2023-10-22 ENCOUNTER — Encounter: Payer: Self-pay | Admitting: *Deleted

## 2023-10-22 MED ORDER — DROSPIRENONE-ETHINYL ESTRADIOL 3-0.02 MG PO TABS: 1.0000 | ORAL_TABLET | Freq: Every day | ORAL | 0 refills | Status: DC

## 2023-10-27 DIAGNOSIS — Z1330 Encounter for screening examination for mental health and behavioral disorders, unspecified: Secondary | ICD-10-CM | POA: Diagnosis not present

## 2023-10-27 DIAGNOSIS — N898 Other specified noninflammatory disorders of vagina: Secondary | ICD-10-CM | POA: Diagnosis not present

## 2023-11-17 DIAGNOSIS — M79641 Pain in right hand: Secondary | ICD-10-CM | POA: Diagnosis not present

## 2023-11-18 ENCOUNTER — Encounter: Payer: Self-pay | Admitting: Family

## 2023-11-18 ENCOUNTER — Ambulatory Visit (INDEPENDENT_AMBULATORY_CARE_PROVIDER_SITE_OTHER): Payer: Self-pay | Admitting: Family

## 2023-11-18 VITALS — BP 134/72 | HR 84 | Temp 99.1°F | Resp 16 | Ht 68.7 in | Wt 230.0 lb

## 2023-11-18 DIAGNOSIS — Z114 Encounter for screening for human immunodeficiency virus [HIV]: Secondary | ICD-10-CM

## 2023-11-18 DIAGNOSIS — R030 Elevated blood-pressure reading, without diagnosis of hypertension: Secondary | ICD-10-CM

## 2023-11-18 DIAGNOSIS — Z309 Encounter for contraceptive management, unspecified: Secondary | ICD-10-CM

## 2023-11-18 DIAGNOSIS — R300 Dysuria: Secondary | ICD-10-CM | POA: Diagnosis not present

## 2023-11-18 DIAGNOSIS — Z23 Encounter for immunization: Secondary | ICD-10-CM | POA: Diagnosis not present

## 2023-11-18 DIAGNOSIS — Z1159 Encounter for screening for other viral diseases: Secondary | ICD-10-CM

## 2023-11-18 DIAGNOSIS — T7800XA Anaphylactic reaction due to unspecified food, initial encounter: Secondary | ICD-10-CM

## 2023-11-18 DIAGNOSIS — Z0000 Encounter for general adult medical examination without abnormal findings: Secondary | ICD-10-CM | POA: Diagnosis not present

## 2023-11-18 LAB — POC URINALSYSI DIPSTICK (AUTOMATED)
Blood, UA: NEGATIVE
Glucose, UA: NEGATIVE
Ketones, UA: NEGATIVE
Nitrite, UA: POSITIVE
Protein, UA: NEGATIVE
Spec Grav, UA: 1.02 (ref 1.010–1.025)
Urobilinogen, UA: 0.2 U/dL
pH, UA: 5 (ref 5.0–8.0)

## 2023-11-18 MED ORDER — NITROFURANTOIN MONOHYD MACRO 100 MG PO CAPS: 100.0000 mg | ORAL_CAPSULE | Freq: Two times a day (BID) | ORAL | 0 refills | Status: DC

## 2023-11-18 MED ORDER — DROSPIRENONE-ETHINYL ESTRADIOL 3-0.02 MG PO TABS: 1.0000 | ORAL_TABLET | Freq: Every day | ORAL | 4 refills | Status: AC

## 2023-11-18 MED ORDER — EPINEPHRINE 0.3 MG/0.3ML IJ SOAJ: INTRAMUSCULAR | 1 refills | Status: AC

## 2023-11-18 NOTE — Assessment & Plan Note (Signed)
 BP Readings from Last 3 Encounters:  11/18/23 134/72  09/16/22 136/85  07/08/22 133/76   BP is stable.  Continue lifestyle modification.

## 2023-11-18 NOTE — Progress Notes (Signed)
 Subjective:     Patient ID: Annette Johns, female    DOB: [birth year], 22 y.o.   MRN: [id number]  Chief Complaint  Patient presents with   Annual Exam    HPI  Discussed the use of AI scribe software for clinical note transcription with the patient, who gave verbal consent to proceed.  History of Present Illness   Annette Johns is a 23 year old female who presents for an annual physical exam.  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 She maintains an active lifestyle with over 12,500 steps daily and gym visits two to three times a week. Her diet includes water, occasional juices, and one cup of coffee daily, with reduced energy drink intake due to high blood pressure concerns. She denies alcohol or drug use. She is sexually active.  She has discontinued Lexapro , resulting in decreased appetite but improved mood. She eats one to two meals a day, with less frequent meals at school compared to home.  She has recurrent UTIs and is currently experiencing symptoms such as a heavy bladder feeling, cloudy urine, and increased frequency of urination.  She is on birth control and requests a refill. She has allergies to peanuts and tree nuts, requiring an EpiPen  refill as the last prescription was in 2019.  She has seen a hand specialist for a mass or lesion on her palm that may require removal. She reports no recent surgeries, cough, cold symptoms, skin concerns, hearing or vision issues, leg swelling, constipation, diarrhea, unusual muscle or joint pain, or frequent headaches. Immunizations: due for Men B Diet: could improve Exercise: walks a lot, gym 2-3 times a week Wt Readings from Last 3 Encounters:  11/18/23 230 lb (104.3 kg)  09/16/22 212 lb (96.2 kg) (98%, Z= 2.11)*  09/05/22 219 lb (99.3 kg) (99%, Z= 2.19)*   * Growth percentiles are based on CDC (Girls, 2-20 Years) data.  Pap Smear: next year Vision/Dental up to  date      Health Maintenance Due  Topic Date Due   CHLAMYDIA SCREENING  Never done   HPV VACCINES (1 - 3-dose series) Never done   HIV Screening  Never done   Hepatitis C Screening  Never done   COVID-19 Vaccine (4 - 2024-25 season) 03/08/2023    Past Medical History:  Diagnosis Date   Allergic rhinitis    Anxiety    Anxiety and depression 06/24/2022   Dermagraphy    Eczema    Gluten intolerance    gets dermatitis   Peanut allergy    Pneumonia     Past Surgical History:  Procedure Laterality Date   ADENOIDECTOMY     2014   APPENDECTOMY  2016   TONSILLECTOMY  2014    Family History  Problem Relation Age of Onset   Allergic rhinitis Mother    Eczema Mother    Urticaria Mother    Arthritis Mother    Eczema Father    Allergic rhinitis Sister    Eczema Sister    Allergic rhinitis Maternal Grandmother    Eczema Maternal Grandmother    COPD Maternal Grandfather    Cancer Paternal Grandmother    Angioedema Neg Hx    Asthma Neg Hx    Immunodeficiency Neg Hx     Social History   Socioeconomic History   Marital status: Single    Spouse name: Not on file   Number of children: Not on file   Years  of education: Not on file   Highest education level: Not on file  Occupational History   Not on file  Tobacco Use   Smoking status: Never   Smokeless tobacco: Never  Vaping Use   Vaping status: Never Used  Substance and Sexual Activity   Alcohol use: Never   Drug use: Never   Sexual activity: Yes    Partners: Male    Birth control/protection: OCP  Other Topics Concern   Not on file  Social History Narrative   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   Has 4 sisters   Social Drivers of Health   Financial Resource Strain: Not on file  Food Insecurity: Not on file  Transportation Needs: Not on file  Physical Activity: Not on file  Stress: Not on file  Social Connections: Unknown (03/08/2022)   Received from  [facility]   Social Network    Social Network: Not on file  Intimate Partner Violence: Unknown (03/08/2022)   Received from [facility]   HITS    Physically Hurt: Not on file    Insult or Talk Down To: Not on file    Threaten Physical Harm: Not on file    Scream or Curse: Not on file    Outpatient Medications Prior to Visit  Medication Sig Dispense Refill   cetirizine (ZYRTEC) 10 MG tablet Take 10 mg by mouth daily as needed for allergies or rhinitis.     drospirenone -ethinyl estradiol  (YAZ) 3-0.02 MG tablet Take 1 tablet by mouth daily. 84 tablet 0   EPINEPHrine  (AUVI-Q ) 0.3 mg/0.3 mL IJ SOAJ injection Use as directed for severe allergic reaction. 4 Device 2   escitalopram  (LEXAPRO ) 10 MG tablet Take 1 tablet (10 mg total) by mouth daily. (Patient not taking: Reported on 11/18/2023) 90 tablet 1   No facility-administered medications prior to visit.    Allergies  Allergen Reactions   Peanut Oil Anaphylaxis and Other (See Comments)    + testing     Review of Systems  Constitutional:  Negative for weight loss.  HENT:  Negative for congestion and hearing loss.   Eyes:  Negative for blurred vision.  Respiratory:  Negative for cough.   Cardiovascular:  Negative for leg swelling.  Gastrointestinal:  Negative for constipation and diarrhea.  Genitourinary:  Positive for dysuria and frequency.  Musculoskeletal:  Negative for joint pain and myalgias.  Skin:  Negative for rash.  Neurological:  Negative for headaches.  Psychiatric/Behavioral:  Negative for depression. The patient is not nervous/anxious.        Objective:     Physical Exam   BP 134/72 (BP Location: Right Arm, Patient Position: Sitting, Cuff Size: Large)   Pulse 84   Temp 99.1 F (37.3 C) (Oral)   Resp 16   Ht 5' 8.7" (1.745 m)   Wt 230 lb (104.3 kg)   SpO2 100%   BMI 34.26 kg/m  Wt Readings from Last 3 Encounters:  11/18/23 230 lb (104.3 kg)  09/16/22 212 lb (96.2 kg)  (98%, Z= 2.11)*  09/05/22 219 lb (99.3 kg) (99%, Z= 2.19)*   * Growth percentiles are based on CDC (Girls, 2-20 Years) data.   Physical Exam  Constitutional: She is oriented to person, place, and time. She appears well-developed and well-nourished. No distress.  HENT:  Head: Normocephalic and atraumatic.  Right Ear: Tympanic membrane and ear canal normal.  Left Ear: Tympanic membrane and ear canal normal.  Mouth/Throat: Oropharynx is clear and moist.  Eyes: Pupils are equal, round, and reactive to light. No scleral icterus.  Neck: Normal range of motion. No thyromegaly present.  Cardiovascular: Normal rate and regular rhythm.   No murmur heard. Pulmonary/Chest: Effort normal and breath sounds normal. No respiratory distress. He has no wheezes. She has no rales. She exhibits no tenderness.  Abdominal: Soft. Bowel sounds are normal. She exhibits no distension and no mass. There is no tenderness. There is no rebound and no guarding.  Musculoskeletal: She exhibits no edema.  Lymphadenopathy:    She has no cervical adenopathy.  Neurological: She is alert and oriented to person, place, and time. She has normal patellar reflexes. She exhibits normal muscle tone. Coordination normal.  Skin: Skin is warm and dry.  Psychiatric: She has a normal mood and affect. Her behavior is normal. Judgment and thought content normal.  Breast/pelvic: deferred        Assessment & Plan:       Assessment & Plan:   Problem List Items Addressed This Visit       Unprioritized   Preventative health care - Primary   BMI 34. Discussed weight management and relationship with food. Encouraged weight goal of 200 pounds. - Set a goal to reduce weight to around 200 pounds. - Encourage a healthy diet and regular exercise. - Advise limiting coffee to one cup per day.  MenB vaccine due. Discussed routine vaccinations and screenings. Up to date on vision and dental. Pap smear to start at 23. - Administer MenB  vaccine. - Schedule Pap smear at next physical. - Perform cholesterol, HIV, and Hep C screening.      Relevant Orders   Lipid panel   Food allergy   Update epipen .      Relevant Medications   EPINEPHrine  (AUVI-Q ) 0.3 mg/0.3 mL IJ SOAJ injection   Elevated blood pressure reading   BP Readings from Last 3 Encounters:  11/18/23 134/72  09/16/22 136/85  07/08/22 133/76   BP is stable.  Continue lifestyle modification.      Dysuria   Has hx of UTI, UA + leuks/nitrites, send for culture and begin empiric macrobid.      Relevant Medications   nitrofurantoin, macrocrystal-monohydrate, (MACROBID) 100 MG capsule   Other Relevant Orders   POCT Urinalysis Dipstick (Automated) (Completed)   Urine Culture   Other Visit Diagnoses       Encounter for contraceptive management, unspecified type       Relevant Medications   drospirenone -ethinyl estradiol  (YAZ) 3-0.02 MG tablet     Encounter for screening for HIV       Relevant Orders   HIV antibody (with reflex)     Encounter for hepatitis C screening test for low risk patient       Relevant Orders   Hepatitis C Antibody     Need for meningococcal vaccination       Relevant Orders   Meningococcal B, OMV (Bexsero) (Completed)      BP Readings from Last 3 Encounters:  11/18/23 134/72  09/16/22 136/85  07/08/22 133/76  No results found for: "CHOL", "HDL", "LDLCALC", "LDLDIRECT", "TRIG", "CHOLHDL"   I have discontinued Margrette D. Bogan's cetirizine and escitalopram . I am also having her start on nitrofurantoin (macrocrystal-monohydrate). Additionally, I am having her maintain her drospirenone -ethinyl estradiol  and EPINEPHrine .  Meds ordered this encounter  Medications   nitrofurantoin, macrocrystal-monohydrate, (MACROBID) 100 MG capsule    Sig: Take 1 capsule (100 mg total) by mouth 2 (  two) times daily.    Dispense:  10 capsule    Refill:  0    Supervising Provider:   Randie Bustle A [[id number]]   drospirenone -ethinyl estradiol   (YAZ) 3-0.02 MG tablet    Sig: Take 1 tablet by mouth daily.    Dispense:  84 tablet    Refill:  4    Supervising Provider:   Randie Bustle A [[id number]]   EPINEPHrine  (AUVI-Q ) 0.3 mg/0.3 mL IJ SOAJ injection    Sig: Use as directed for severe allergic reaction.    Dispense:  4 each    Refill:  1    Supervising Provider:   Randie Bustle A [[id number]]

## 2023-11-18 NOTE — Assessment & Plan Note (Signed)
 Update epipen

## 2023-11-18 NOTE — Patient Instructions (Addendum)
 VISIT SUMMARY:  Today, you had your annual physical exam. We discussed your active lifestyle, dietary habits, and several health concerns including recurrent UTIs, elevated blood pressure, weight management, and allergies. We also reviewed your current medications and vaccinations.  YOUR PLAN:  URINARY TRACT INFECTION: You have recurrent UTIs with symptoms like a heavy bladder feeling, cloudy urine, and increased frequency of urination. -We will send your urine for a culture test. -You will be prescribed antibiotics pending the culture results. -If the culture results are negative, you should stop taking the antibiotics.  ELEVATED BLOOD PRESSURE: Your blood pressure is on the high side of normal. -Monitor your blood pressure regularly. -Continue to reduce your intake of energy drinks.  OBESITY: Your BMI is 34, and we discussed the importance of weight management. -Set a goal to reduce your weight to around 200 pounds. -Maintain a healthy diet and regular exercise. -Limit your coffee intake to one cup per day.  DEPRESSION: Your mood is well-managed after discontinuing Lexapro , and you are eating adequately. -Monitor your mood and eating habits. -If there are any changes in your mood or eating habits, we will reassess the need for Lexapro .  PEANUT AND TREE NUT ALLERGY: You have allergies to peanuts and tree nuts and need a new EpiPen  prescription. -A new EpiPen  prescription will be sent to [facility].  GENERAL HEALTH MAINTENANCE: We discussed routine vaccinations and screenings. You are up to date on vision and dental check-ups. -You are overdue for the MenB vaccine, which we will administer. -Schedule a Pap smear at your next physical. -We will perform cholesterol, HIV, and Hep C screenings.

## 2023-11-18 NOTE — Assessment & Plan Note (Signed)
 Has hx of UTI, UA + leuks/nitrites, send for culture and begin empiric macrobid.

## 2023-11-18 NOTE — Assessment & Plan Note (Signed)
 BMI 34. Discussed weight management and relationship with food. Encouraged weight goal of 200 pounds. - Set a goal to reduce weight to around 200 pounds. - Encourage a healthy diet and regular exercise. - Advise limiting coffee to one cup per day.  MenB vaccine due. Discussed routine vaccinations and screenings. Up to date on vision and dental. Pap smear to start at 23. - Administer MenB vaccine. - Schedule Pap smear at next physical. - Perform cholesterol, HIV, and Hep C screening.

## 2023-11-19 LAB — LIPID PANEL
Cholesterol: 336 mg/dL — ABNORMAL HIGH (ref 0–200)
HDL: 85.1 mg/dL (ref >39.00)
LDL Cholesterol: 229 mg/dL — ABNORMAL HIGH (ref 0–99)
NonHDL: 250.46
Total CHOL/HDL Ratio: 4
Triglycerides: 108 mg/dL (ref 0.0–149.0)
VLDL: 21.6 mg/dL (ref 0.0–40.0)

## 2023-11-19 LAB — HEPATITIS C ANTIBODY: Hepatitis C Ab: NONREACTIVE

## 2023-11-19 LAB — HIV ANTIBODY (ROUTINE TESTING W REFLEX): HIV 1&2 Ab, 4th Generation: NONREACTIVE

## 2023-11-20 LAB — URINE CULTURE
MICRO NUMBER:: 16454791
SPECIMEN QUALITY:: ADEQUATE

## 2023-11-22 ENCOUNTER — Ambulatory Visit: Payer: Self-pay | Admitting: Family

## 2023-11-22 DIAGNOSIS — E785 Hyperlipidemia, unspecified: Secondary | ICD-10-CM | POA: Insufficient documentation

## 2023-11-22 NOTE — Telephone Encounter (Signed)
 See mychart.

## 2024-01-05 DIAGNOSIS — Z113 Encounter for screening for infections with a predominantly sexual mode of transmission: Secondary | ICD-10-CM | POA: Diagnosis not present

## 2024-01-05 DIAGNOSIS — R300 Dysuria: Secondary | ICD-10-CM | POA: Diagnosis not present

## 2024-01-05 LAB — OB RESULTS CONSOLE GC/CHLAMYDIA: Chlamydia: NEGATIVE

## 2024-01-07 ENCOUNTER — Encounter: Payer: Self-pay | Admitting: Family

## 2024-02-16 ENCOUNTER — Ambulatory Visit: Admitting: Family

## 2024-02-16 VITALS — BP 129/61 | HR 77 | Temp 99.0°F | Resp 16 | Ht 69.0 in | Wt 234.0 lb

## 2024-02-16 DIAGNOSIS — F32A Depression, unspecified: Secondary | ICD-10-CM

## 2024-02-16 DIAGNOSIS — F419 Anxiety disorder, unspecified: Secondary | ICD-10-CM | POA: Diagnosis not present

## 2024-02-16 DIAGNOSIS — R350 Frequency of micturition: Secondary | ICD-10-CM | POA: Diagnosis not present

## 2024-02-16 LAB — POC URINALSYSI DIPSTICK (AUTOMATED)
Bilirubin, UA: NEGATIVE
Glucose, UA: NEGATIVE
Ketones, UA: NEGATIVE
Leukocytes, UA: NEGATIVE
Nitrite, UA: NEGATIVE
Protein, UA: NEGATIVE
Spec Grav, UA: <=1.005 — AB (ref 1.010–1.025)
Urobilinogen, UA: 0.2 U/dL
pH, UA: 6.5 (ref 5.0–8.0)

## 2024-02-16 MED ORDER — ESCITALOPRAM OXALATE 10 MG PO TABS: 10.0000 mg | ORAL_TABLET | Freq: Every day | ORAL | 0 refills | Status: DC

## 2024-02-16 NOTE — Assessment & Plan Note (Signed)
 UA notes blood but pt on menses.  Send for culture. Treatment plan pending review of culture results.

## 2024-02-16 NOTE — Progress Notes (Signed)
 Subjective:     Patient ID: Annette Johns, female    DOB: [birth year], 22 y.o.   MRN: [id number]  Chief Complaint  Patient presents with   Anxiety    Patient complains of increased anxiety   Urinary Frequency    Patient complains of frequency, cloudy urine on and off    Anxiety    Urinary Frequency  Associated symptoms include frequency.    Discussed the use of AI scribe software for clinical note transcription with the patient, who gave verbal consent to proceed.  History of Present Illness Annette Johns is a 23 year old female with a history of anxiety who presents with increased anxiety and stress.  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 She has difficulty focusing, feels 'clouded,' and experiences a 'weight on her chest' when taking deep breaths. She feels more worried and down about things.  She has not been evaluated for ADHD but is concerned about her recent inability to focus, which has worsened over the past two years, especially during the past spring semester and this summer.  Her sleep pattern involves staying up late, often due to screen time, and she has trouble falling asleep even after putting her phone away. She typically goes to bed around 1 to 2 AM and wakes up around 9 or 10 AM, but she stays asleep throughout the night.  She mentions a fluctuating appetite, sometimes having little appetite and other times feeling an increased desire to snack. She was on Lexapro  two years ago, which initially helped but seemed to lose effectiveness after nine months.  She reports urinary symptoms that began one to two weeks ago, including sporadic cloudy urine with a different odor and a heavy bladder feeling. These symptoms improve with increased water intake but recur the next morning. No burning sensation, just heaviness in the  bladder.      Health Maintenance Due  Topic Date Due   COVID-19 Vaccine (4 - 2024-25 season) 03/08/2023   INFLUENZA VACCINE  02/05/2024    Past Medical History:  Diagnosis Date   Allergic rhinitis    Anxiety    Anxiety and depression 06/24/2022   Dermagraphy    Eczema    Gluten intolerance    gets dermatitis   Peanut allergy    Pneumonia     Past Surgical History:  Procedure Laterality Date   ADENOIDECTOMY     2014   APPENDECTOMY  2016   TONSILLECTOMY  2014    Family History  Problem Relation Age of Onset   Allergic rhinitis Mother    Eczema Mother    Urticaria Mother    Arthritis Mother    Eczema Father    Allergic rhinitis Sister    Eczema Sister    Allergic rhinitis Maternal Grandmother    Eczema Maternal Grandmother    COPD Maternal Grandfather    Cancer Paternal Grandmother    Angioedema Neg Hx    Asthma Neg Hx    Immunodeficiency Neg Hx     Social History   Socioeconomic History   Marital status: Single    Spouse name: Not on file   Number of children: Not on file   Years of education: Not on file   Highest education level: Not on file  Occupational History   Not on file  Tobacco Use   Smoking status: Never   Smokeless tobacco: Never  Vaping Use  Vaping status: Never Used  Substance and Sexual Activity   Alcohol use: Never   Drug use: Never   Sexual activity: Yes    Partners: Male    Birth control/protection: OCP  Other Topics Concern   Not on file  Social History Narrative   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   Has 4 sisters   Social Drivers of Health   Financial Resource Strain: Not on file  Food Insecurity: Not on file  Transportation Needs: Not on file  Physical Activity: Not on file  Stress: Not on file  Social Connections: Unknown (03/08/2022)   Received from [facility]   Social Network    Social Network: Not on file  Intimate Partner Violence: Unknown (03/08/2022)    Received from [facility]   HITS    Physically Hurt: Not on file    Insult or Talk Down To: Not on file    Threaten Physical Harm: Not on file    Scream or Curse: Not on file    Outpatient Medications Prior to Visit  Medication Sig Dispense Refill   drospirenone -ethinyl estradiol  (YAZ) 3-0.02 MG tablet Take 1 tablet by mouth daily. 84 tablet 4   EPINEPHrine  (AUVI-Q ) 0.3 mg/0.3 mL IJ SOAJ injection Use as directed for severe allergic reaction. 4 each 1   nitrofurantoin , macrocrystal-monohydrate, (MACROBID ) 100 MG capsule Take 1 capsule (100 mg total) by mouth 2 (two) times daily. 10 capsule 0   No facility-administered medications prior to visit.    Allergies  Allergen Reactions   Peanut Oil Anaphylaxis and Other (See Comments)    + testing     Review of Systems  Genitourinary:  Positive for frequency.       Objective:    Physical Exam Constitutional:      Appearance: Normal appearance. She is well-developed.  HENT:     Head: Normocephalic and atraumatic.  Cardiovascular:     Rate and Rhythm: Normal rate and regular rhythm.     Heart sounds: Normal heart sounds. No murmur heard. Pulmonary:     Effort: Pulmonary effort is normal. No respiratory distress.     Breath sounds: Normal breath sounds. No wheezing.  Abdominal:     Tenderness: There is no right CVA tenderness or left CVA tenderness.     Comments: Mild suprapubic tenderness  Neurological:     Mental Status: She is alert.  Psychiatric:        Behavior: Behavior normal.        Thought Content: Thought content normal.        Judgment: Judgment normal.      BP 129/61 (BP Location: Right Arm, Patient Position: Sitting, Cuff Size: Normal)   Pulse 77   Temp 99 F (37.2 C) (Oral)   Resp 16   Ht 5' 9 (1.753 m)   Wt 234 lb (106.1 kg)   SpO2 100%   BMI 34.56 kg/m  Wt Readings from Last 3 Encounters:  02/16/24 234 lb (106.1 kg)  11/18/23 230 lb (104.3 kg)  09/16/22 212 lb (96.2 kg) (98%, Z= 2.11)*    * Growth percentiles are based on CDC (Girls, 2-20 Years) data.       Assessment & Plan:   Problem List Items Addressed This Visit       Unprioritized   Urinary frequency - Primary   UA notes blood but pt on menses.  Send for culture. Treatment plan pending review of culture results.  Relevant Orders   POCT Urinalysis Dipstick (Automated) (Completed)   Urine Culture   Anxiety and depression    Increased anxiety and stress with symptoms of difficulty focusing, chest pressure, and fluctuating appetite. Lexapro  previously effective for a time but she discontinued.  - Prescribed Lexapro  10 mg, start with half tablet at bedtime for the first week, then increase to full tablet on week two. - Discussed potential dosage increase if symptoms return after initial improvement. - Recommended virtual follow-up visit while in state. - Encouraged exploring counseling services through school or online therapy options. - consider referral for ADHD evaluation if focus does not improve with anxiety treatment.      Relevant Medications   escitalopram  (LEXAPRO ) 10 MG tablet    I have discontinued Kenisha D. Paar's nitrofurantoin  (macrocrystal-monohydrate). I am also having her start on escitalopram . Additionally, I am having her maintain her drospirenone -ethinyl estradiol  and EPINEPHrine .  Meds ordered this encounter  Medications   escitalopram  (LEXAPRO ) 10 MG tablet    Sig: Take 1 tablet (10 mg total) by mouth daily.    Dispense:  30 tablet    Refill:  0    Supervising Provider:   DOMENICA BLACKBIRD A [[id number]]

## 2024-02-16 NOTE — Assessment & Plan Note (Signed)
  Increased anxiety and stress with symptoms of difficulty focusing, chest pressure, and fluctuating appetite. Lexapro  previously effective for a time but she discontinued.  - Prescribed Lexapro  10 mg, start with half tablet at bedtime for the first week, then increase to full tablet on week two. - Discussed potential dosage increase if symptoms return after initial improvement. - Recommended virtual follow-up visit while in state. - Encouraged exploring counseling services through school or online therapy options. - consider referral for ADHD evaluation if focus does not improve with anxiety treatment.

## 2024-02-16 NOTE — Patient Instructions (Signed)
 VISIT SUMMARY:  During your visit, we discussed your increased anxiety and stress, sleep disturbances, and urinary symptoms. We have adjusted your medication and recommended some lifestyle changes to help manage your symptoms.  YOUR PLAN:  DEPRESSION AND ANXIETY: You are experiencing increased anxiety and stress, difficulty focusing,and fluctuating appetite. Lexapro  was previously effective but lost its effectiveness after nine months. -lets restart taking Lexapro  10 mg. Begin with half a tablet at bedtime for the first week, then increase to a full tablet on week two. -We may consider increasing the dosage if your symptoms return after initial improvement. -Schedule a virtual follow-up visit while you are in state. -Explore counseling services through your school or online therapy options.  SLEEP DISTURBANCE: You have difficulty falling asleep, possibly related to screen time, with sleep from 1-2 AM to 9-10 AM. This may be related to your anxiety and stress. -Try to reduce screen time before bed to help improve your sleep.  URINARY SYMPTOMS, POSSIBLE URINARY TRACT INFECTION: You have intermittent cloudy urine with odor and a heavy bladder feeling. These symptoms improve with increased water intake. Your urinalysis showed blood, likely due to menstruation, but no other significant findings. -We have sent your urine sample for culture and are awaiting the results to determine further management.

## 2024-02-18 ENCOUNTER — Ambulatory Visit: Payer: Self-pay | Admitting: Family

## 2024-02-18 LAB — URINE CULTURE
MICRO NUMBER:: 16819816
SPECIMEN QUALITY:: ADEQUATE

## 2024-02-18 NOTE — Telephone Encounter (Signed)
 Patient called and inform this is not something that needs treatment.

## 2024-02-22 DIAGNOSIS — Z1330 Encounter for screening examination for mental health and behavioral disorders, unspecified: Secondary | ICD-10-CM | POA: Diagnosis not present

## 2024-02-22 DIAGNOSIS — Z113 Encounter for screening for infections with a predominantly sexual mode of transmission: Secondary | ICD-10-CM | POA: Diagnosis not present

## 2024-02-22 DIAGNOSIS — R300 Dysuria: Secondary | ICD-10-CM | POA: Diagnosis not present

## 2024-02-22 DIAGNOSIS — N398 Other specified disorders of urinary system: Secondary | ICD-10-CM | POA: Diagnosis not present

## 2024-03-14 DIAGNOSIS — I10 Essential (primary) hypertension: Secondary | ICD-10-CM | POA: Diagnosis not present

## 2024-03-14 DIAGNOSIS — B3731 Acute candidiasis of vulva and vagina: Secondary | ICD-10-CM | POA: Diagnosis not present

## 2024-03-14 DIAGNOSIS — R8290 Unspecified abnormal findings in urine: Secondary | ICD-10-CM | POA: Diagnosis not present

## 2024-03-14 DIAGNOSIS — N898 Other specified noninflammatory disorders of vagina: Secondary | ICD-10-CM | POA: Diagnosis not present

## 2024-05-03 ENCOUNTER — Telehealth: Admitting: Family

## 2024-05-03 DIAGNOSIS — F419 Anxiety disorder, unspecified: Secondary | ICD-10-CM

## 2024-05-03 DIAGNOSIS — S5002XA Contusion of left elbow, initial encounter: Secondary | ICD-10-CM | POA: Diagnosis not present

## 2024-05-03 DIAGNOSIS — F32A Depression, unspecified: Secondary | ICD-10-CM | POA: Diagnosis not present

## 2024-05-03 MED ORDER — ESCITALOPRAM OXALATE 20 MG PO TABS: 20.0000 mg | ORAL_TABLET | Freq: Every day | ORAL | 0 refills | Status: AC

## 2024-05-03 NOTE — Progress Notes (Deleted)
 Subjective:     Patient ID: Annette Johns, female    DOB: 23-Jun-2001, 23 y.o.   MRN: [id number]  Chief Complaint  Patient presents with   Anxiety    Still having symptoms with lexapro  10 mg.     HPI  Discussed the use of AI scribe software for clinical note transcription with the patient, who gave verbal consent to proceed.  History of Present Illness  Annette Johns is a 23 year old female who presents for medication management and follow-up for anxiety and stress. She experiences ongoing anxiety and stress, with symptoms managed by Lexapro  10 mg. Despite some improvement, she continues to have significant symptoms, including increased tearfulness and sadness. Discontinuation of the medication for a week worsened her condition, and she feels the current dosage may be insufficient.   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 She utilizes campus psychological services for group therapy but prefers medication management through her primary care provider. She has issues with focus and a short attention span, which she attributes to anxiety. There is a family history of ADHD, but she has not undergone formal testing.   Today, she fell on a slippery brick, resulting in an inflamed elbow. She can move it but experiences discomfort and pain with certain movements.        Health Maintenance Due  Topic Date Due   Influenza Vaccine  02/05/2024   COVID-19 Vaccine (4 - 2025-26 season) 03/07/2024   Cervical Cancer Screening (Pap smear)  Never done    Past Medical History:  Diagnosis Date   Allergic rhinitis    Anxiety    Anxiety and depression 06/24/2022   Dermagraphy    Eczema    Gluten intolerance    gets dermatitis   Peanut allergy    Pneumonia     Past Surgical History:  Procedure Laterality Date   ADENOIDECTOMY     2014   APPENDECTOMY  2016   TONSILLECTOMY  2014    Family History   Problem Relation Age of Onset   Allergic rhinitis Mother    Eczema Mother    Urticaria Mother    Arthritis Mother    Eczema Father    Allergic rhinitis Sister    Eczema Sister    Allergic rhinitis Maternal Grandmother    Eczema Maternal Grandmother    COPD Maternal Grandfather    Cancer Paternal Grandmother    Angioedema Neg Hx    Asthma Neg Hx    Immunodeficiency Neg Hx     Social History   Socioeconomic History   Marital status: Single    Spouse name: Not on file   Number of children: Not on file   Years of education: Not on file   Highest education level: Not on file  Occupational History   Not on file  Tobacco Use   Smoking status: Never   Smokeless tobacco: Never  Vaping Use   Vaping status: Never Used  Substance and Sexual Activity   Alcohol use: Never   Drug use: Never   Sexual activity: Yes    Partners: Male    Birth control/protection: OCP  Other Topics Concern   Not on file  Social History Narrative   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   Has 4 sisters   Social Drivers of Corporate Investment Banker Strain: Not on file  Food  Insecurity: Not on file  Transportation Needs: Not on file  Physical Activity: Not on file  Stress: Not on file  Social Connections: Unknown (03/08/2022)   Received from [facility]   Social Network    Social Network: Not on file  Intimate Partner Violence: Unknown (03/08/2022)   Received from [facility]   HITS    Physically Hurt: Not on file    Insult or Talk Down To: Not on file    Threaten Physical Harm: Not on file    Scream or Curse: Not on file    Outpatient Medications Prior to Visit  Medication Sig Dispense Refill   drospirenone -ethinyl estradiol  (YAZ) 3-0.02 MG tablet Take 1 tablet by mouth daily. 84 tablet 4   EPINEPHrine  (AUVI-Q ) 0.3 mg/0.3 mL IJ SOAJ injection Use as directed for severe allergic reaction. 4 each 1   escitalopram  (LEXAPRO ) 10 MG tablet  Take 1 tablet (10 mg total) by mouth daily. 30 tablet 0   No facility-administered medications prior to visit.    Allergies  Allergen Reactions   Peanut Oil Anaphylaxis and Other (See Comments)    + testing     ROS See HPI    Objective:    Physical Exam   There were no vitals taken for this visit. Wt Readings from Last 3 Encounters:  02/16/24 234 lb (106.1 kg)  11/18/23 230 lb (104.3 kg)  09/16/22 212 lb (96.2 kg) (98%, Z= 2.11)*   * Growth percentiles are based on CDC (Girls, 2-20 Years) data.    Gen: Awake, alert, no acute distress Resp: Breathing is even and non-labored Psych: calm/pleasant demeanor Neuro: Alert and Oriented x 3, + facial symmetry, speech is clear. Musculoskeletal: mild swelling of left olecranon bursa    Assessment & Plan:   Problem List Items Addressed This Visit       Unprioritized   Contusion of left elbow   Recommend icing, call if symptoms worsen or if not improved in 1-2 weeks.      Anxiety and depression - Primary    Persistent symptoms despite Lexapro  10 mg. Improvement noted on medication, worsened after discontinuation.  - Prescribe Lexapro  20 mg, start with 10 mg for one week, then increase to 20 mg.  - Discussed benefits of dose increase and gradual titration to minimize side effects.  - Explore private counseling options through insurance or online therapy services.    Attention and concentration deficit Ongoing difficulties, possibly related to anxiety. No prior ADHD testing. - Monitor attention and concentration after Lexapro  dose increase. - Consider ADHD testing if no improvement in focus after anxiety treatment.       Relevant Medications   escitalopram  (LEXAPRO ) 20 MG tablet    I have discontinued Jasmynn D. Roker's escitalopram . I am also having her start on escitalopram . Additionally, I am having her maintain her drospirenone -ethinyl estradiol  and EPINEPHrine .  Meds ordered this encounter  Medications    escitalopram  (LEXAPRO ) 20 MG tablet    Sig: Take 1 tablet (20 mg total) by mouth daily.    Dispense:  90 tablet    Refill:  0    Supervising Provider:   DOMENICA BLACKBIRD A [[id number]]

## 2024-05-03 NOTE — Patient Instructions (Signed)
 VISIT SUMMARY:  You visited us  today for medication management and follow-up for your anxiety and stress. We discussed your ongoing symptoms and made adjustments to your medication. Additionally, we addressed your focus issues and a recent injury to your elbow.  YOUR PLAN:  ANXIETY AND DEPRESSIVE SYMPTOMS: You have ongoing anxiety and depressive symptoms that have not fully improved with your current medication dosage. -Increase Lexapro  to 20 mg. Start with 10 mg for one week, then increase to 20 mg. -We discussed the benefits of increasing the dose and the importance of gradual titration to minimize side effects. -Consider exploring private counseling options through your insurance or online therapy services.  ATTENTION AND CONCENTRATION DEFICIT: You have difficulties with focus and attention, which may be related to your anxiety. -Monitor your attention and concentration after increasing the Lexapro  dose. -Consider ADHD testing if there is no improvement in focus after treating your anxiety.  RIGHT ELBOW CONTUSION: You have an acute contusion on your right elbow with swelling and discomfort, but you can still move it fully. -Apply ice to your right elbow several times a day for the next few days.

## 2024-05-03 NOTE — Progress Notes (Signed)
 MyChart Video Visit    Virtual Visit via Video Note    Patient location: Home. Patient and provider in visit Provider location: Office  I discussed the limitations of evaluation and management by telemedicine and the availability of in person appointments. The patient expressed understanding and agreed to proceed.  Visit Date: 05/03/2024  Today's healthcare provider: Eleanor GORMAN Ponto, NP     Subjective:    Patient ID: Annette Johns, female    DOB: 10-18-2000, 23 y.o.   MRN: [id number]  Chief Complaint  Patient presents with   Anxiety    Still having symptoms with lexapro  10 mg.     HPI  Annette Johns is a 23 year old female who presents for medication management and follow-up for anxiety and stress. She experiences ongoing anxiety and stress, with symptoms managed by Lexapro  10 mg. Despite some improvement, she continues to have significant symptoms, including increased tearfulness and sadness. Discontinuation of the medication for a week worsened her condition, and she feels the current dosage may be insufficient. 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 She utilizes campus psychological services for group therapy but prefers medication management through her primary care provider. She has issues with focus and a short attention span, which she attributes to anxiety. There is a family history of ADHD, but she has not undergone formal testing. Today, she fell on a slippery brick, resulting in an inflamed elbow. She can move it but experiences discomfort and pain with certain movements.   Past Medical History:  Diagnosis Date   Allergic rhinitis    Anxiety    Anxiety and depression 06/24/2022   Dermagraphy    Eczema    Gluten intolerance    gets dermatitis   Peanut allergy    Pneumonia     Past Surgical History:  Procedure Laterality Date   ADENOIDECTOMY     2014   APPENDECTOMY   2016   TONSILLECTOMY  2014    Family History  Problem Relation Age of Onset   Allergic rhinitis Mother    Eczema Mother    Urticaria Mother    Arthritis Mother    Eczema Father    Allergic rhinitis Sister    Eczema Sister    Allergic rhinitis Maternal Grandmother    Eczema Maternal Grandmother    COPD Maternal Grandfather    Cancer Paternal Grandmother    Angioedema Neg Hx    Asthma Neg Hx    Immunodeficiency Neg Hx     Social History   Socioeconomic History   Marital status: Single    Spouse name: Not on file   Number of children: Not on file   Years of education: Not on file   Highest education level: Not on file  Occupational History   Not on file  Tobacco Use   Smoking status: Never   Smokeless tobacco: Never  Vaping Use   Vaping status: Never Used  Substance and Sexual Activity   Alcohol use: Never   Drug use: Never   Sexual activity: Yes    Partners: Male    Birth control/protection: OCP  Other Topics Concern   Not on file  Social History Narrative   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   Has 4 sisters   Social Drivers of Health   Financial Resource Strain: Not on file  Food Insecurity: Not on  file  Transportation Needs: Not on file  Physical Activity: Not on file  Stress: Not on file  Social Connections: Unknown (03/08/2022)   Received from [facility]   Social Network    Social Network: Not on file  Intimate Partner Violence: Unknown (03/08/2022)   Received from [facility]   HITS    Physically Hurt: Not on file    Insult or Talk Down To: Not on file    Threaten Physical Harm: Not on file    Scream or Curse: Not on file    Outpatient Medications Prior to Visit  Medication Sig Dispense Refill   drospirenone -ethinyl estradiol  (YAZ) 3-0.02 MG tablet Take 1 tablet by mouth daily. 84 tablet 4   EPINEPHrine  (AUVI-Q ) 0.3 mg/0.3 mL IJ SOAJ injection Use as directed for severe allergic reaction. 4  each 1   escitalopram  (LEXAPRO ) 10 MG tablet Take 1 tablet (10 mg total) by mouth daily. 30 tablet 0   No facility-administered medications prior to visit.    Allergies  Allergen Reactions   Peanut Oil Anaphylaxis and Other (See Comments)    + testing     ROS     Objective:    Physical Exam  There were no vitals taken for this visit. Wt Readings from Last 3 Encounters:  02/16/24 234 lb (106.1 kg)  11/18/23 230 lb (104.3 kg)  09/16/22 212 lb (96.2 kg) (98%, Z= 2.11)*   * Growth percentiles are based on CDC (Girls, 2-20 Years) data.   Gen: Awake, alert, no acute distress Resp: Breathing is even and non-labored Psych: calm/pleasant demeanor Neuro: Alert and Oriented x 3, + facial symmetry, speech is clear.      Assessment & Plan:   Problem List Items Addressed This Visit       Unprioritized   Contusion of left elbow   Recommend icing, call if symptoms worsen or if not improved in 1-2 weeks.      Anxiety and depression - Primary    Persistent symptoms despite Lexapro  10 mg. Improvement noted on medication, worsened after discontinuation.  - Prescribe Lexapro  20 mg, start with 10 mg for one week, then increase to 20 mg.  - Discussed benefits of dose increase and gradual titration to minimize side effects.  - Explore private counseling options through insurance or online therapy services.    Attention and concentration deficit Ongoing difficulties, possibly related to anxiety. No prior ADHD testing. - Monitor attention and concentration after Lexapro  dose increase. - Consider ADHD testing if no improvement in focus after anxiety treatment.       Relevant Medications   escitalopram  (LEXAPRO ) 20 MG tablet    I have discontinued Hedwig D. Vandyke's escitalopram . I am also having her start on escitalopram . Additionally, I am having her maintain her drospirenone -ethinyl estradiol  and EPINEPHrine .  Meds ordered this encounter  Medications   escitalopram  (LEXAPRO ) 20  MG tablet    Sig: Take 1 tablet (20 mg total) by mouth daily.    Dispense:  90 tablet    Refill:  0    Supervising Provider:   DOMENICA BLACKBIRD A [[id number]]    I discussed the assessment and treatment plan with the patient. The patient was provided an opportunity to ask questions and all were answered. The patient agreed with the plan and demonstrated an understanding of the instructions.   The patient was advised to call back or seek an in-person evaluation if the symptoms worsen or if the condition fails to improve as anticipated.  Eleanor GORMAN Ponto, NP [facility] [phone] (phone) [phone] (fax)  [facility]

## 2024-05-03 NOTE — Assessment & Plan Note (Signed)
 Recommend icing, call if symptoms worsen or if not improved in 1-2 weeks.

## 2024-05-03 NOTE — Assessment & Plan Note (Signed)
  Persistent symptoms despite Lexapro  10 mg. Improvement noted on medication, worsened after discontinuation.  - Prescribe Lexapro  20 mg, start with 10 mg for one week, then increase to 20 mg.  - Discussed benefits of dose increase and gradual titration to minimize side effects.  - Explore private counseling options through insurance or online therapy services.    Attention and concentration deficit Ongoing difficulties, possibly related to anxiety. No prior ADHD testing. - Monitor attention and concentration after Lexapro  dose increase. - Consider ADHD testing if no improvement in focus after anxiety treatment.

## 2024-05-04 ENCOUNTER — Ambulatory Visit: Admitting: Family

## 2024-06-06 DIAGNOSIS — N898 Other specified noninflammatory disorders of vagina: Secondary | ICD-10-CM | POA: Diagnosis not present

## 2024-06-06 DIAGNOSIS — Z1330 Encounter for screening examination for mental health and behavioral disorders, unspecified: Secondary | ICD-10-CM | POA: Diagnosis not present
